# Patient Record
Sex: Female | Born: 1975 | Race: White | Hispanic: No | Marital: Married | Smoking: Never smoker
Health system: Southern US, Community
[De-identification: ages and names within clinical notes are randomized; demographics above are authoritative.]

## PROBLEM LIST (undated history)

## (undated) DIAGNOSIS — F419 Anxiety disorder, unspecified: Secondary | ICD-10-CM

---

## 2021-01-28 ENCOUNTER — Emergency Department
Admission: EM | Admit: 2021-01-28 | Discharge: 2021-01-28 | Disposition: A | Discharge status: Home / Self Care | Payer: BC Managed Care – PPO | Attending: Emergency Medicine | Admitting: Emergency Medicine

## 2021-01-28 ENCOUNTER — Other Ambulatory Visit: Payer: Self-pay

## 2021-01-28 ENCOUNTER — Emergency Department: Payer: BC Managed Care – PPO

## 2021-01-28 DIAGNOSIS — R55 Syncope and collapse: Secondary | ICD-10-CM | POA: Diagnosis not present

## 2021-01-28 DIAGNOSIS — Z20822 Contact with and (suspected) exposure to covid-19: Secondary | ICD-10-CM | POA: Diagnosis not present

## 2021-01-28 DIAGNOSIS — R000 Tachycardia, unspecified: Secondary | ICD-10-CM

## 2021-01-28 HISTORY — DX: Anxiety disorder, unspecified: F41.9

## 2021-01-28 LAB — TROPONIN I (HIGH SENSITIVITY)
Troponin I (High Sensitivity): <2 ng/L (ref <18)
Troponin I (High Sensitivity): <2 ng/L (ref <18)

## 2021-01-28 LAB — CBC WITH DIFFERENTIAL/PLATELET
Abs Immature Granulocytes: 0.02 10*3/uL (ref 0.00–0.07)
Basophils Absolute: 0.1 10*3/uL (ref 0.0–0.1)
Basophils Relative: 1 %
Eosinophils Absolute: 0.5 10*3/uL (ref 0.0–0.5)
Eosinophils Relative: 7 %
HCT: 34.8 % — ABNORMAL LOW (ref 36.0–46.0)
Hemoglobin: 12 g/dL (ref 12.0–15.0)
Immature Granulocytes: 0 %
Lymphocytes Relative: 35 %
Lymphs Abs: 2.2 10*3/uL (ref 0.7–4.0)
MCH: 29.1 pg (ref 26.0–34.0)
MCHC: 34.5 g/dL (ref 30.0–36.0)
MCV: 84.3 fL (ref 80.0–100.0)
Monocytes Absolute: 0.4 10*3/uL (ref 0.1–1.0)
Monocytes Relative: 7 %
Neutro Abs: 3.2 10*3/uL (ref 1.7–7.7)
Neutrophils Relative %: 50 %
Platelets: 246 10*3/uL (ref 150–400)
RBC: 4.13 MIL/uL (ref 3.87–5.11)
RDW: 12.8 % (ref 11.5–15.5)
WBC: 6.3 10*3/uL (ref 4.0–10.5)
nRBC: 0 % (ref 0.0–0.2)

## 2021-01-28 LAB — COMPREHENSIVE METABOLIC PANEL
ALT: 18 U/L (ref 0–44)
AST: 21 U/L (ref 15–41)
Albumin: 4.1 g/dL (ref 3.5–5.0)
Alkaline Phosphatase: 48 U/L (ref 38–126)
Anion gap: 8 (ref 5–15)
BUN: 12 mg/dL (ref 6–20)
CO2: 25 mmol/L (ref 22–32)
Calcium: 9 mg/dL (ref 8.9–10.3)
Chloride: 106 mmol/L (ref 98–111)
Creatinine, Ser: 0.63 mg/dL (ref 0.44–1.00)
GFR, Estimated: >60 mL/min (ref >60)
Glucose, Bld: 159 mg/dL — ABNORMAL HIGH (ref 70–99)
Potassium: 3.3 mmol/L — ABNORMAL LOW (ref 3.5–5.1)
Sodium: 139 mmol/L (ref 135–145)
Total Bilirubin: 0.5 mg/dL (ref 0.3–1.2)
Total Protein: 6.9 g/dL (ref 6.5–8.1)

## 2021-01-28 LAB — T4, FREE: Free T4: 1.26 ng/dL — ABNORMAL HIGH (ref 0.61–1.12)

## 2021-01-28 LAB — RESP PANEL BY RT-PCR (FLU A&B, COVID) ARPGX2
Influenza A by PCR: NEGATIVE
Influenza B by PCR: NEGATIVE
SARS Coronavirus 2 by RT PCR: NEGATIVE

## 2021-01-28 LAB — TSH: TSH: 0.347 u[IU]/mL — ABNORMAL LOW (ref 0.350–4.500)

## 2021-01-28 LAB — MAGNESIUM: Magnesium: 1.8 mg/dL (ref 1.7–2.4)

## 2021-01-28 LAB — D-DIMER, QUANTITATIVE: D-Dimer, Quant: 0.33 ug/mL-FEU (ref 0.00–0.50)

## 2021-01-28 MED ORDER — HYDROXYZINE HCL 25 MG PO TABS
25.0000 mg | ORAL_TABLET | Freq: Once | ORAL | Status: AC
  Administered 2021-01-28: 25 mg via ORAL
  Filled 2021-01-28: qty 1

## 2021-01-28 MED ORDER — SODIUM CHLORIDE 0.9 % IV BOLUS
1000.0000 mL | Freq: Once | INTRAVENOUS | Status: AC
  Administered 2021-01-28: 1000 mL via INTRAVENOUS

## 2021-01-28 MED ORDER — POTASSIUM CHLORIDE CRYS ER 20 MEQ PO TBCR
40.0000 meq | EXTENDED_RELEASE_TABLET | Freq: Once | ORAL | Status: AC
  Administered 2021-01-28: 40 meq via ORAL
  Filled 2021-01-28: qty 2

## 2021-01-28 NOTE — ED Notes (Signed)
Patient transported to X-ray 

## 2021-01-28 NOTE — ED Triage Notes (Signed)
Pt BIBA bc she fell alseep in the car for 30 min while her family was shopping with the [facility] on and woke up "not feeling good". Pt c/o feeling "out of it", dyspnea, and rapid heart rate. Pt felt lik she was going to pass out. Hx of anxiety attacks. HR 100 on arrival. Oxygen 100% on room air. Pt feels better on arrival. No SHOB present, pt able to talk in complete sentences.

## 2021-01-28 NOTE — ED Notes (Signed)
Pt agreeable with d/c plan as discussed by provider- this nurse has verbally reinforced d/c instructions and provided pt with written copy - pt acknowledges verbal understanding and denies any additional questions, concerns, needs- ambulatory independently at discharge with steady gait; no distress - escorted by spouse.

## 2021-01-28 NOTE — ED Provider Notes (Signed)
[facility] Emergency Department Provider Note  ____________________________________________   Event Date/Time   First MD Initiated Contact with Patient 01/28/21 1813     (approximate)  I have reviewed the triage vital signs and the nursing notes.   HISTORY  Chief Complaint Near Syncope    HPI Sheila Sutton is a 45 y.o. female with anxiety who comes in for elevated heart rates.  Patient states that she fell asleep while her family was returning something from the store.  She was asleep for about 30 minutes with her condition on and she woke up not feeling well.  She stated that she felt like her heart was racing and that she felt like she was going to pass out.  This was constant, nothing made it better, nothing made it worse.  Patient reports a history of anxiety attacks but does not feel that she is had this happen before.  Patient's oxygen levels were normal.  Patient denies feeling short of breath at this time or any chest pain.           Past Medical History:  Diagnosis Date  . Anxiety     There are no problems to display for this patient.   History reviewed. No pertinent surgical history.  Prior to Admission medications   Not on File    Allergies Patient has no known allergies.  History reviewed. No pertinent family history.  Social History Social History   Tobacco Use  . Smoking status: Never Smoker  . Smokeless tobacco: Never Used  Substance Use Topics  . Alcohol use: Yes      Review of Systems Constitutional: No fever/chills Eyes: No visual changes. ENT: No sore throat. Cardiovascular: Denies chest pain.  Palpitation Respiratory: Positive shortness of breath Gastrointestinal: No abdominal pain.  No nausea, no vomiting.  No diarrhea.  No constipation. Genitourinary: Negative for dysuria. Musculoskeletal: Negative for back pain. Skin: Negative for rash. Neurological: Negative for headaches, focal weakness or  numbness. All other ROS negative ____________________________________________   PHYSICAL EXAM:  VITAL SIGNS: ED Triage Vitals  Enc Vitals Group     BP 01/28/21 1820 139/70     Pulse Rate 01/28/21 1820 100     Resp 01/28/21 1820 20     Temp 01/28/21 1820 98 F (36.7 C)     Temp Source 01/28/21 1820 Oral     SpO2 01/28/21 1820 100 %     Weight 01/28/21 1821 127 lb (57.6 kg)     Height 01/28/21 1821 5\' 4"  (1.626 m)     Head Circumference --      Peak Flow --      Pain Score 01/28/21 1820 0     Pain Loc --      Pain Edu? --      Excl. in GC? --     Constitutional: Alert and oriented. Well appearing and in no acute distress. Eyes: Conjunctivae are normal. EOMI. Head: Atraumatic. Nose: No congestion/rhinnorhea. Mouth/Throat: Mucous membranes are moist.   Neck: No stridor. Trachea Midline. FROM Cardiovascular: Tachycardic, regular rhythm. Grossly normal heart sounds.  Good peripheral circulation. Respiratory: Normal respiratory effort.  No retractions. Lungs CTAB. Gastrointestinal: Soft and nontender. No distention. No abdominal bruits.  Musculoskeletal: No lower extremity tenderness nor edema.  No joint effusions. Neurologic:  Normal speech and language. No gross focal neurologic deficits are appreciated.  Skin:  Skin is warm, dry and intact. No rash noted. Psychiatric: Mood and affect are normal. Speech and behavior are  normal.  Reports feeling slightly anxious GU: Deferred   ____________________________________________   LABS (all labs ordered are listed, but only abnormal results are displayed)  Labs Reviewed  CBC WITH DIFFERENTIAL/PLATELET - Abnormal; Notable for the following components:      Result Value   HCT 34.8 (*)    All other components within normal limits  COMPREHENSIVE METABOLIC PANEL - Abnormal; Notable for the following components:   Potassium 3.3 (*)    Glucose, Bld 159 (*)    All other components within normal limits  TSH - Abnormal; Notable for  the following components:   TSH 0.347 (*)    All other components within normal limits  T4, FREE - Abnormal; Notable for the following components:   Free T4 1.26 (*)    All other components within normal limits  RESP PANEL BY RT-PCR (FLU A&B, COVID) ARPGX2  D-DIMER, QUANTITATIVE  MAGNESIUM  POC URINE PREG, ED  TROPONIN I (HIGH SENSITIVITY)  TROPONIN I (HIGH SENSITIVITY)   ____________________________________________   ED ECG REPORT I, Concha Se, the attending physician, personally viewed and interpreted this ECG.  Normal sinus rate of 92, no ST elevation, no T wave inversions, normal intervals ____________________________________________  RADIOLOGY Vela Prose, personally viewed and evaluated these images (plain radiographs) as part of my medical decision making, as well as reviewing the written report by the radiologist.  ED MD interpretation: No pneumonia  Official radiology report(s): DG Chest 2 View  Result Date: 01/28/2021 CLINICAL DATA:  Shortness of breath. EXAM: CHEST - 2 VIEW COMPARISON:  None. FINDINGS: The cardiomediastinal contours are normal. Minimal biapical pleuroparenchymal scarring. Pulmonary vasculature is normal. No consolidation, pleural effusion, or pneumothorax. No acute osseous abnormalities are seen. IMPRESSION: No acute chest findings. Electronically Signed   By: Narda Rutherford M.D.   On: 01/28/2021 19:00    ____________________________________________   PROCEDURES  Procedure(s) performed (including Critical Care):  .1-3 Lead EKG Interpretation Performed by: Concha Se, MD Authorized by: Concha Se, MD     Interpretation: abnormal     ECG rate:  90s -100s   ECG rate assessment: normal     Rhythm: sinus tachycardia     Ectopy: none     Conduction: normal       ____________________________________________   INITIAL IMPRESSION / ASSESSMENT AND PLAN / ED COURSE  Sheila Sutton was evaluated in Emergency Department on 01/28/2021  for the symptoms described in the history of present illness. She was evaluated in the context of the global COVID-19 pandemic, which necessitated consideration that the patient might be at risk for infection with the SARS-CoV-2 virus that causes COVID-19. Institutional protocols and algorithms that pertain to the evaluation of patients at risk for COVID-19 are in a state of rapid change based on information released by regulatory bodies including the CDC and federal and state organizations. These policies and algorithms were followed during the patient's care in the ED.    Patient comes in with tachycardia that appears to be normal saline.  EMS did not get a EKG when it was faster at 120 therefore we will get EKG now keep on the cardiac monitor.  Will get labs to evaluate for Electra MIs, AKI, COVID, PE, ACS.  We will give patient 1 L of fluid due to her tachycardia and patient requesting something for anxiety will give some hydroxyzine.  COVID-negative, D-dimer negative.  Potassium slightly low will give some oral repletion.  Thyroid is slightly elevated  follow-up with her  primary care doctor.  Reevaluated patient.  Patient been ambulatory without recurrent symptoms.  Potassium was replaced.  Patient is remains asymptomatic and feels comfortable with discharge home at this time and will follow up with her PCP for her thyroid.  Also recommended following up with cardiology for Holter monitor if she continues to have episodes of tachycardia  I discussed the provisional nature of ED diagnosis, the treatment so far, the ongoing plan of care, follow up appointments and return precautions with the patient and any family or support people present. They expressed understanding and agreed with the plan, discharged home.         ____________________________________________   FINAL CLINICAL IMPRESSION(S) / ED DIAGNOSES   Final diagnoses:  None      MEDICATIONS GIVEN DURING THIS  VISIT:  Medications  hydrOXYzine (ATARAX/VISTARIL) tablet 25 mg (25 mg Oral Given 01/28/21 1835)  sodium chloride 0.9 % bolus 1,000 mL (1,000 mLs Intravenous New Bag/Given 01/28/21 1835)     ED Discharge Orders    None       Note:  This document was prepared using Dragon voice recognition software and may include unintentional dictation errors.   Concha Se, MD 01/28/21 2206

## 2021-01-28 NOTE — Discharge Instructions (Signed)
Follow-up with your primary care doctor for your abnormal thyroid test because your medications may need to be adjusted.  You can follow-up with cardiology if continue to have episodes of tachycardia for discussion of Holter monitor.  Return to the ER if you have recurrent symptoms, shortness of breath, chest pain or any other concerns

## 2021-11-09 IMAGING — CR DG CHEST 2V
1 series · 2 of 2 positions shown · non-contrast
Comparison: None.

CLINICAL DATA: Shortness of breath.

EXAM:
CHEST - 2 VIEW

[Series 1: dg chest 2 view · 0.14mm/px · 2 of 2 slices shown]
[im 1/2]
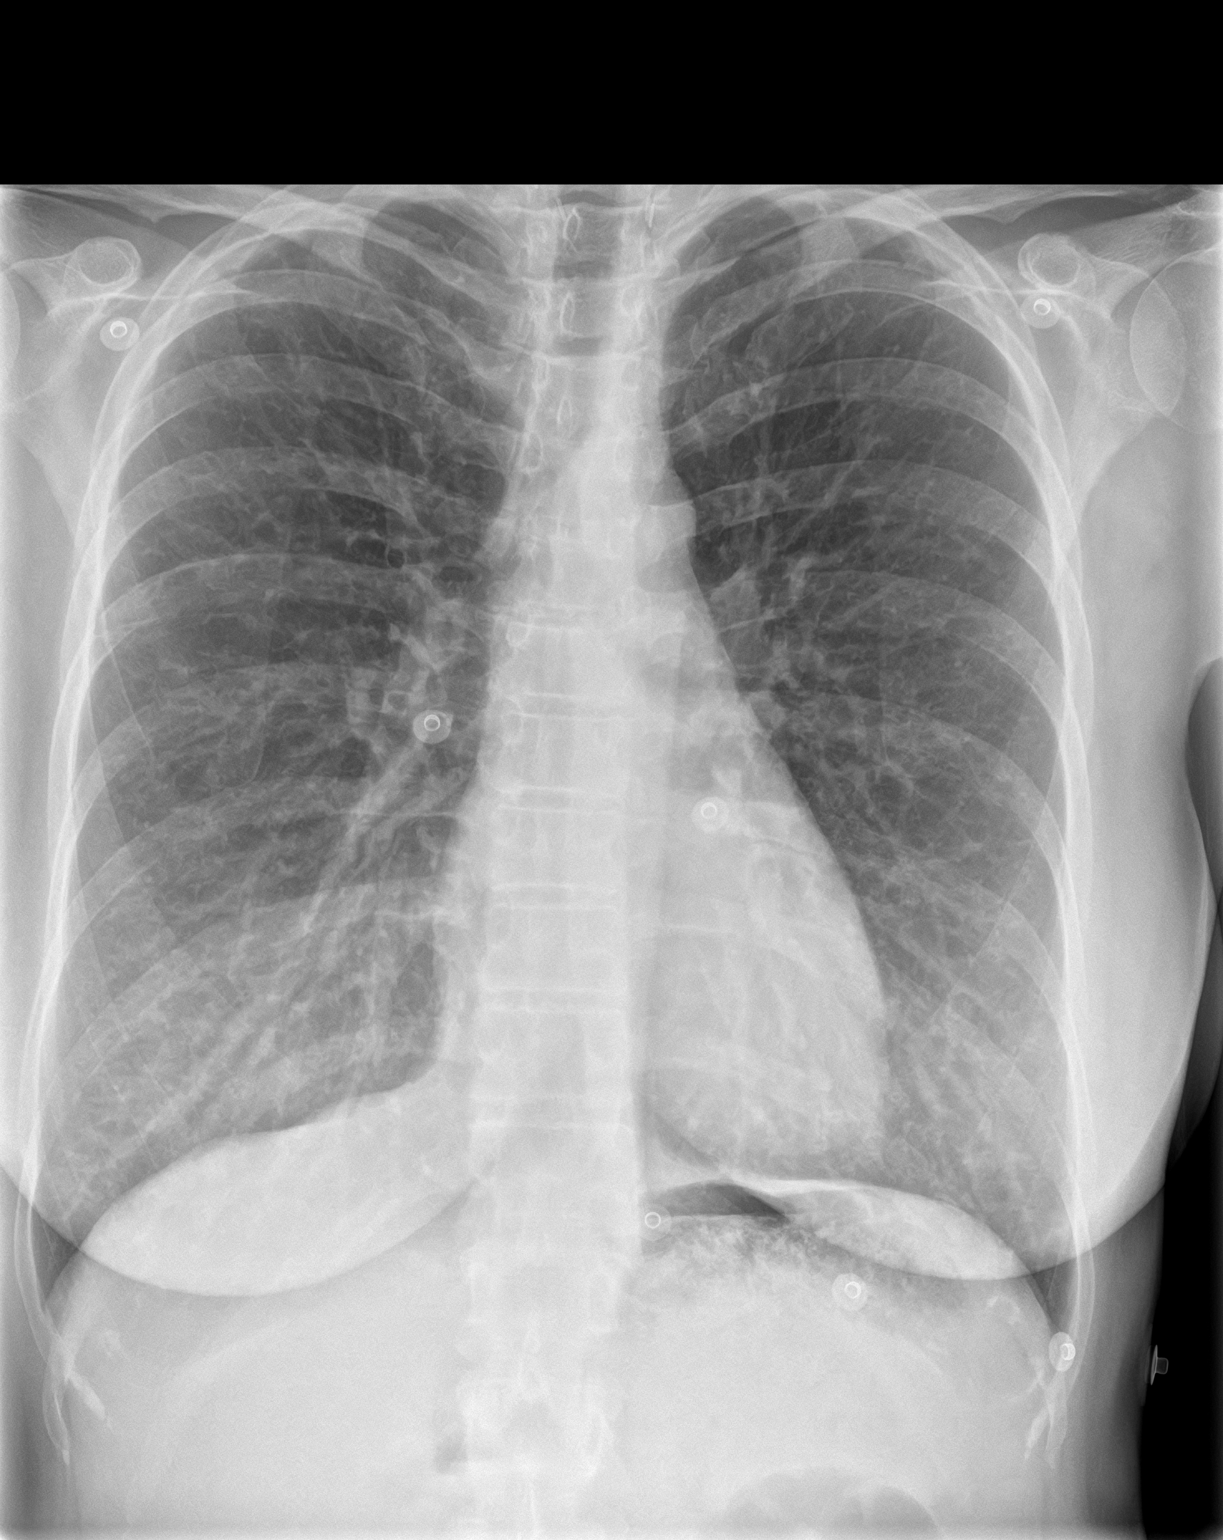
[im 2/2]
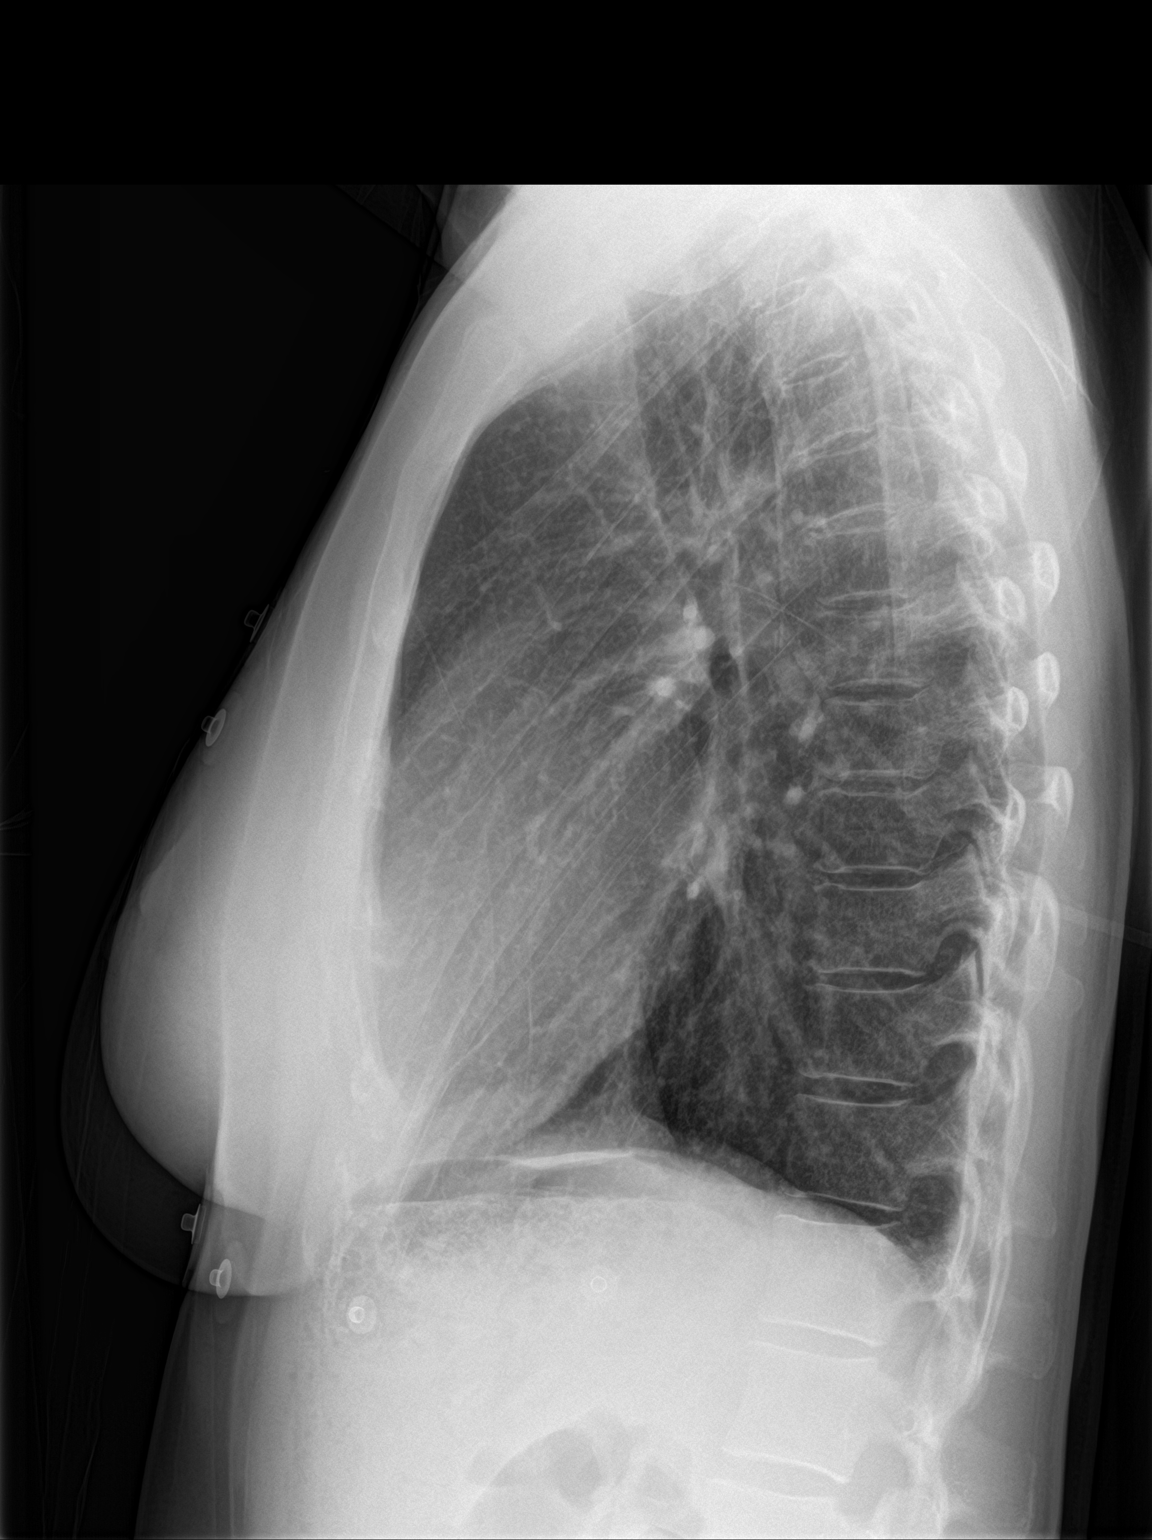

[2 of 2 positions shown; findings below may reference images not displayed]

FINDINGS: The cardiomediastinal contours are normal. Minimal biapical
pleuroparenchymal scarring. Pulmonary vasculature is normal. No
consolidation, pleural effusion, or pneumothorax. No acute osseous
abnormalities are seen.
IMPRESSION: No acute chest findings.
